# Patient Record
Sex: Male | Born: 1980
Health system: Southern US, Community
[De-identification: ages and names within clinical notes are randomized; demographics above are authoritative.]

## PROBLEM LIST (undated history)

## (undated) DIAGNOSIS — M25559 Pain in unspecified hip: Secondary | ICD-10-CM

## (undated) DIAGNOSIS — K219 Gastro-esophageal reflux disease without esophagitis: Secondary | ICD-10-CM

## (undated) DIAGNOSIS — E785 Hyperlipidemia, unspecified: Secondary | ICD-10-CM

## (undated) DIAGNOSIS — R079 Chest pain, unspecified: Secondary | ICD-10-CM

## (undated) HISTORY — DX: Hyperlipidemia, unspecified: E78.5

## (undated) HISTORY — DX: Pain in unspecified hip: M25.559

## (undated) HISTORY — DX: Gastro-esophageal reflux disease without esophagitis: K21.9

## (undated) HISTORY — PX: REPAIR IMPERFORATE ANUS / ANORECTOPLASTY: SUR1185

## (undated) HISTORY — PX: OTHER SURGICAL HISTORY: SHX169

## (undated) HISTORY — DX: Chest pain, unspecified: R07.9

---

## 2014-11-07 ENCOUNTER — Telehealth (HOSPITAL_COMMUNITY): Payer: Self-pay | Admitting: *Deleted

## 2014-11-08 ENCOUNTER — Other Ambulatory Visit (HOSPITAL_COMMUNITY): Payer: Self-pay | Admitting: Family Medicine

## 2014-11-08 DIAGNOSIS — R079 Chest pain, unspecified: Secondary | ICD-10-CM

## 2014-11-09 ENCOUNTER — Ambulatory Visit (HOSPITAL_COMMUNITY)
Admission: RE | Admit: 2014-11-09 | Discharge: 2014-11-09 | Disposition: A | Discharge status: Home / Self Care | Payer: 59 | Source: Ambulatory Visit | Attending: Family Medicine | Admitting: Family Medicine

## 2014-11-09 DIAGNOSIS — R079 Chest pain, unspecified: Secondary | ICD-10-CM | POA: Insufficient documentation

## 2014-11-10 ENCOUNTER — Other Ambulatory Visit (HOSPITAL_COMMUNITY): Payer: Self-pay | Admitting: Family Medicine

## 2014-11-10 DIAGNOSIS — R079 Chest pain, unspecified: Secondary | ICD-10-CM

## 2014-11-16 ENCOUNTER — Other Ambulatory Visit (HOSPITAL_COMMUNITY): Payer: 59

## 2014-12-09 DIAGNOSIS — M25559 Pain in unspecified hip: Secondary | ICD-10-CM | POA: Insufficient documentation

## 2014-12-09 DIAGNOSIS — R079 Chest pain, unspecified: Secondary | ICD-10-CM | POA: Insufficient documentation

## 2014-12-09 DIAGNOSIS — E785 Hyperlipidemia, unspecified: Secondary | ICD-10-CM | POA: Insufficient documentation

## 2014-12-09 DIAGNOSIS — K219 Gastro-esophageal reflux disease without esophagitis: Secondary | ICD-10-CM | POA: Insufficient documentation

## 2014-12-13 ENCOUNTER — Ambulatory Visit (INDEPENDENT_AMBULATORY_CARE_PROVIDER_SITE_OTHER): Payer: 59 | Admitting: Cardiology

## 2014-12-13 ENCOUNTER — Encounter: Payer: Self-pay | Admitting: Cardiology

## 2014-12-13 VITALS — BP 100/70 | HR 56 | Ht 66.0 in | Wt 137.8 lb

## 2014-12-13 DIAGNOSIS — R079 Chest pain, unspecified: Secondary | ICD-10-CM | POA: Diagnosis not present

## 2014-12-13 DIAGNOSIS — E785 Hyperlipidemia, unspecified: Secondary | ICD-10-CM | POA: Diagnosis not present

## 2014-12-13 DIAGNOSIS — K219 Gastro-esophageal reflux disease without esophagitis: Secondary | ICD-10-CM

## 2014-12-13 NOTE — Patient Instructions (Signed)
Medication Instructions:  The current medical regimen is effective;  continue present plan and medications.  Testing/Procedures: Your physician has requested that you have an exercise tolerance test. For further information please visit [email or web address] Please also follow instruction sheet, as given.  Follow-Up: Further follow up will be based on these results.  Thank you for choosing [facility]!!    If you need a refill on your cardiac medications before your next appointment, please call your pharmacy.

## 2014-12-13 NOTE — Progress Notes (Signed)
Cardiology Office Note   Date:  12/13/2014   ID:  Ronald Wilkins, DOB [birth year], MRN [id number]  PCP:  Frederich ChickWEBB, CAROL D, MD  Cardiologist:   Donato SchultzSKAINS, Elisha Mcgruder, MD       History of Present Illness: Ronald PasseyBhavinkumar Stalzer is a 34 y.o. male who presents for evaluation of cardiovascular risk in the setting of intermittent chest pain. Strong history of cardiac disease. Sometimes has intermittent chest pain normally lasting a proximal by 30 minutes duration but may be GERD.  Sometimes on treadmill feels some tightness occasionally. Sometimes after the run 30 min after feels tightness. This is intermittently.  2 years ago right sided pain after standing for prolonged times. Did ECG normal.   Dr. Yehuda BuddSpears felt CP and diaphoresis.   No DM, no HTN, no smoking. Had some while dancing. Rarely sweating. Drinks lots of water.   Grandfather 8160 died MI, 32 year old grandfather sibling died. Father has HTN.   Hemoglobin 15.4, creatinine 0.84, glucose 94, liver functions normal, TSH 0.7, LDL 134, triglycerides 177, HDL 32, non-HDL 169 labs were done by Dr. Shirlean Mylararol Webb. Has a positive history of positive PPD, 9 months of INH.    Past Medical History  Diagnosis Date  . GERD (gastroesophageal reflux disease)   . Chest pain   . Hip pain   . Hyperlipidemia     Past Surgical History  Procedure Laterality Date  . Colonscopy    . Repair imperforate anus / anorectoplasty       Current Outpatient Prescriptions  Medication Sig Dispense Refill  . pantoprazole (PROTONIX) 40 MG tablet Take 40 mg by mouth daily as needed (FOR STOMACH AND HEARTBURN).   5   No current facility-administered medications for this visit.    Allergies:   Review of patient's allergies indicates no known allergies.    Social History:  The patient  reports that he has never smoked. He does not have any smokeless tobacco history on file. He reports that he does not drink alcohol or use illicit drugs.   Family History:  The  patient's family history includes Healthy in his mother; Heart disease in his paternal grandfather and paternal uncle; Hypertension in his father; Kidney Stones in his brother.    ROS:  Please see the history of present illness.   Otherwise, review of systems are positive for occasional GERD-like symptoms.   All other systems are reviewed and negative.    PHYSICAL EXAM: VS:  BP 100/70 mmHg  Pulse 56  Ht 5\' 6"  (1.676 m)  Wt 137 lb 12.8 oz (62.506 kg)  BMI 22.25 kg/m2 , BMI Body mass index is 22.25 kg/(m^2). GEN: Well nourished, well developed, in no acute distress HEENT: normal Neck: no JVD, carotid bruits, or masses Cardiac: RRR; no murmurs, rubs, or gallops,no edema  Respiratory:  clear to auscultation bilaterally, normal work of breathing GI: soft, nontender, nondistended, + BS MS: no deformity or atrophy Skin: warm and dry, no rash Neuro:  Strength and sensation are intact Psych: euthymic mood, full affect   EKG:  Today 12/13/14-heart rate 56, sinus rhythm, premature atrial contractions noted. No other abnormalities noted. Personally reviewed.  Recent Labs: No results found for requested labs within last 365 days.    Lipid Panel No results found for: CHOL, TRIG, HDL, CHOLHDL, VLDL, LDLCALC, LDLDIRECT    Wt Readings from Last 3 Encounters:  12/13/14 137 lb 12.8 oz (62.506 kg)      Other studies Reviewed: Additional studies/ records that were  reviewed today include: Prior lab work, office notes, EKG. Review of the above records demonstrates: none   ASSESSMENT AND PLAN:  Atypical chest pain  - At times feels chest discomfort with exertion but not always while exercising. He has also felt some discomfort at times when dancing. We will go ahead and check an exercise treadmill test given his strong family history.  Family history of CAD  - Strong on his father's side.  - Continue with aggressive risk factor prevention.  Elevated cholesterol  - LDL is improved with  diet and exercise. Previously LDL was in the 160 range, currently in the 130 range. At this time, no indication for statin therapy. Continue with dietary and exercise modification.  GERD  - Consider trying Pepcid. Currently using Protonix.   Current medicines are reviewed at length with the patient today.  The patient does not have concerns regarding medicines.  The following changes have been made:  no change  Labs/ tests ordered today include:   Orders Placed This Encounter  Procedures  . Exercise Tolerance Test  . EKG 12-Lead     Disposition:   We will follow-up with the results of testing.  Mathews Robinsons, MD  12/13/2014 3:52 PM    [facility] [address] [place], [place], [place]  [address] Phone: [phone]; Fax: [phone]

## 2015-01-03 ENCOUNTER — Encounter: Payer: 59 | Admitting: Cardiology

## 2015-01-03 ENCOUNTER — Ambulatory Visit (INDEPENDENT_AMBULATORY_CARE_PROVIDER_SITE_OTHER): Payer: 59

## 2015-01-03 DIAGNOSIS — R079 Chest pain, unspecified: Secondary | ICD-10-CM | POA: Diagnosis not present

## 2015-01-03 DIAGNOSIS — E785 Hyperlipidemia, unspecified: Secondary | ICD-10-CM | POA: Diagnosis not present

## 2015-01-03 LAB — EXERCISE TOLERANCE TEST
CHL CUP RESTING HR STRESS: 72 {beats}/min
CSEPED: 10 min
CSEPHR: 101 %
Estimated workload: 11.7 METS
Exercise duration (sec): 0 s
MPHR: 188 {beats}/min
Peak HR: 190 {beats}/min
RPE: 16

## 2015-09-06 DIAGNOSIS — N529 Male erectile dysfunction, unspecified: Secondary | ICD-10-CM | POA: Diagnosis not present

## 2015-10-24 DIAGNOSIS — E785 Hyperlipidemia, unspecified: Secondary | ICD-10-CM | POA: Diagnosis not present

## 2015-10-24 DIAGNOSIS — Z0000 Encounter for general adult medical examination without abnormal findings: Secondary | ICD-10-CM | POA: Diagnosis not present

## 2015-12-07 MED FILL — PANTOPRAZOLE SOD DR 40 MG T: 40 | 90 days supply | Qty: 90 | Fill #0

## 2016-01-31 DIAGNOSIS — H52223 Regular astigmatism, bilateral: Secondary | ICD-10-CM | POA: Diagnosis not present

## 2016-02-06 ENCOUNTER — Telehealth (HOSPITAL_COMMUNITY): Payer: Self-pay | Admitting: Family Medicine

## 2016-02-06 ENCOUNTER — Other Ambulatory Visit: Payer: Self-pay | Admitting: Family Medicine

## 2016-02-06 DIAGNOSIS — R55 Syncope and collapse: Secondary | ICD-10-CM

## 2016-02-07 NOTE — Telephone Encounter (Signed)
Close encounter 

## 2016-02-09 ENCOUNTER — Other Ambulatory Visit: Payer: Self-pay

## 2016-02-09 ENCOUNTER — Ambulatory Visit (HOSPITAL_COMMUNITY): Payer: 59 | Attending: Cardiology

## 2016-02-09 DIAGNOSIS — E785 Hyperlipidemia, unspecified: Secondary | ICD-10-CM | POA: Insufficient documentation

## 2016-02-09 DIAGNOSIS — R55 Syncope and collapse: Secondary | ICD-10-CM

## 2016-02-09 DIAGNOSIS — Z8249 Family history of ischemic heart disease and other diseases of the circulatory system: Secondary | ICD-10-CM | POA: Diagnosis not present

## 2016-07-09 MED FILL — PANTOPRAZOLE SOD DR 40 MG T: 40 | 90 days supply | Qty: 90 | Fill #1

## 2016-08-17 IMAGING — DX DG CHEST 2V
2 series · 2 of 2 positions shown · non-contrast
Comparison: None.

CLINICAL DATA: Chest pain.

EXAM:
CHEST  2 VIEW

[chest pa]
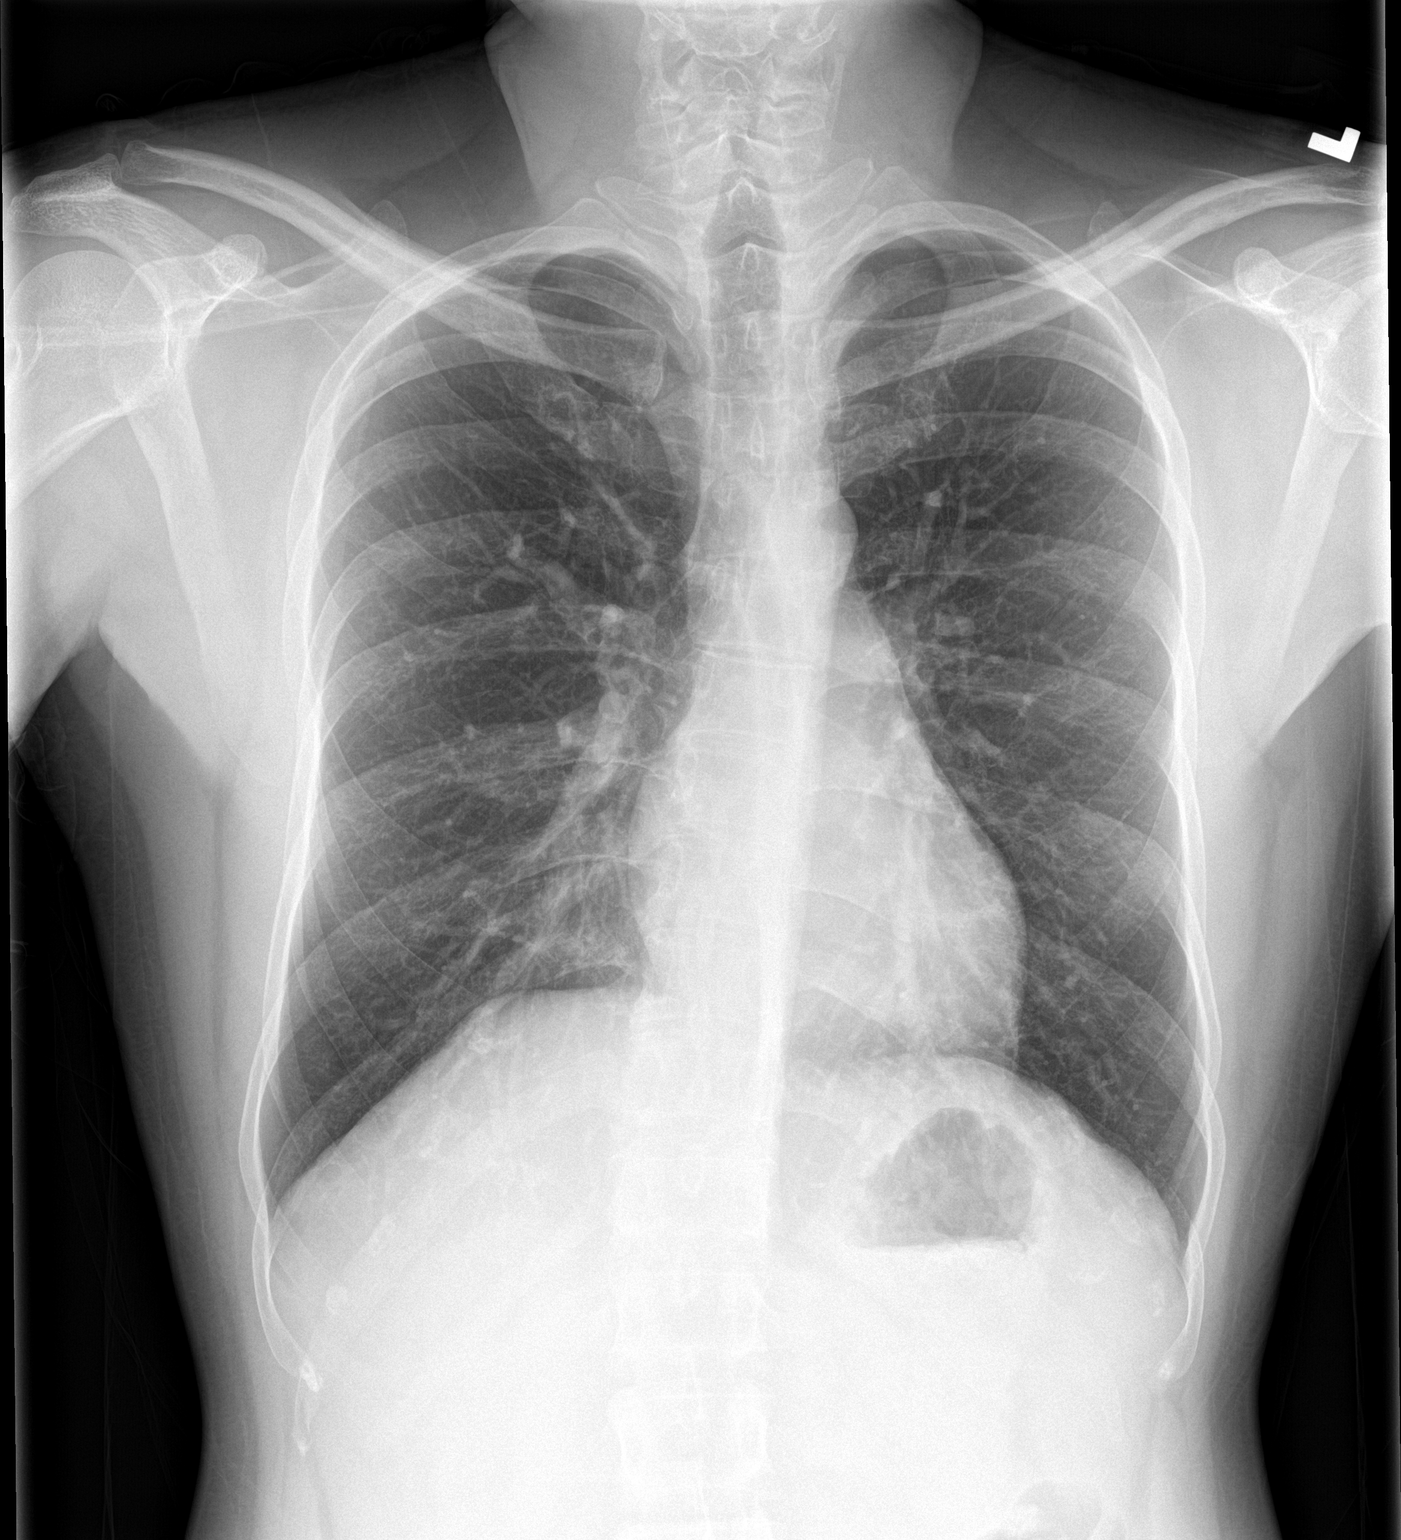

[chest lat]
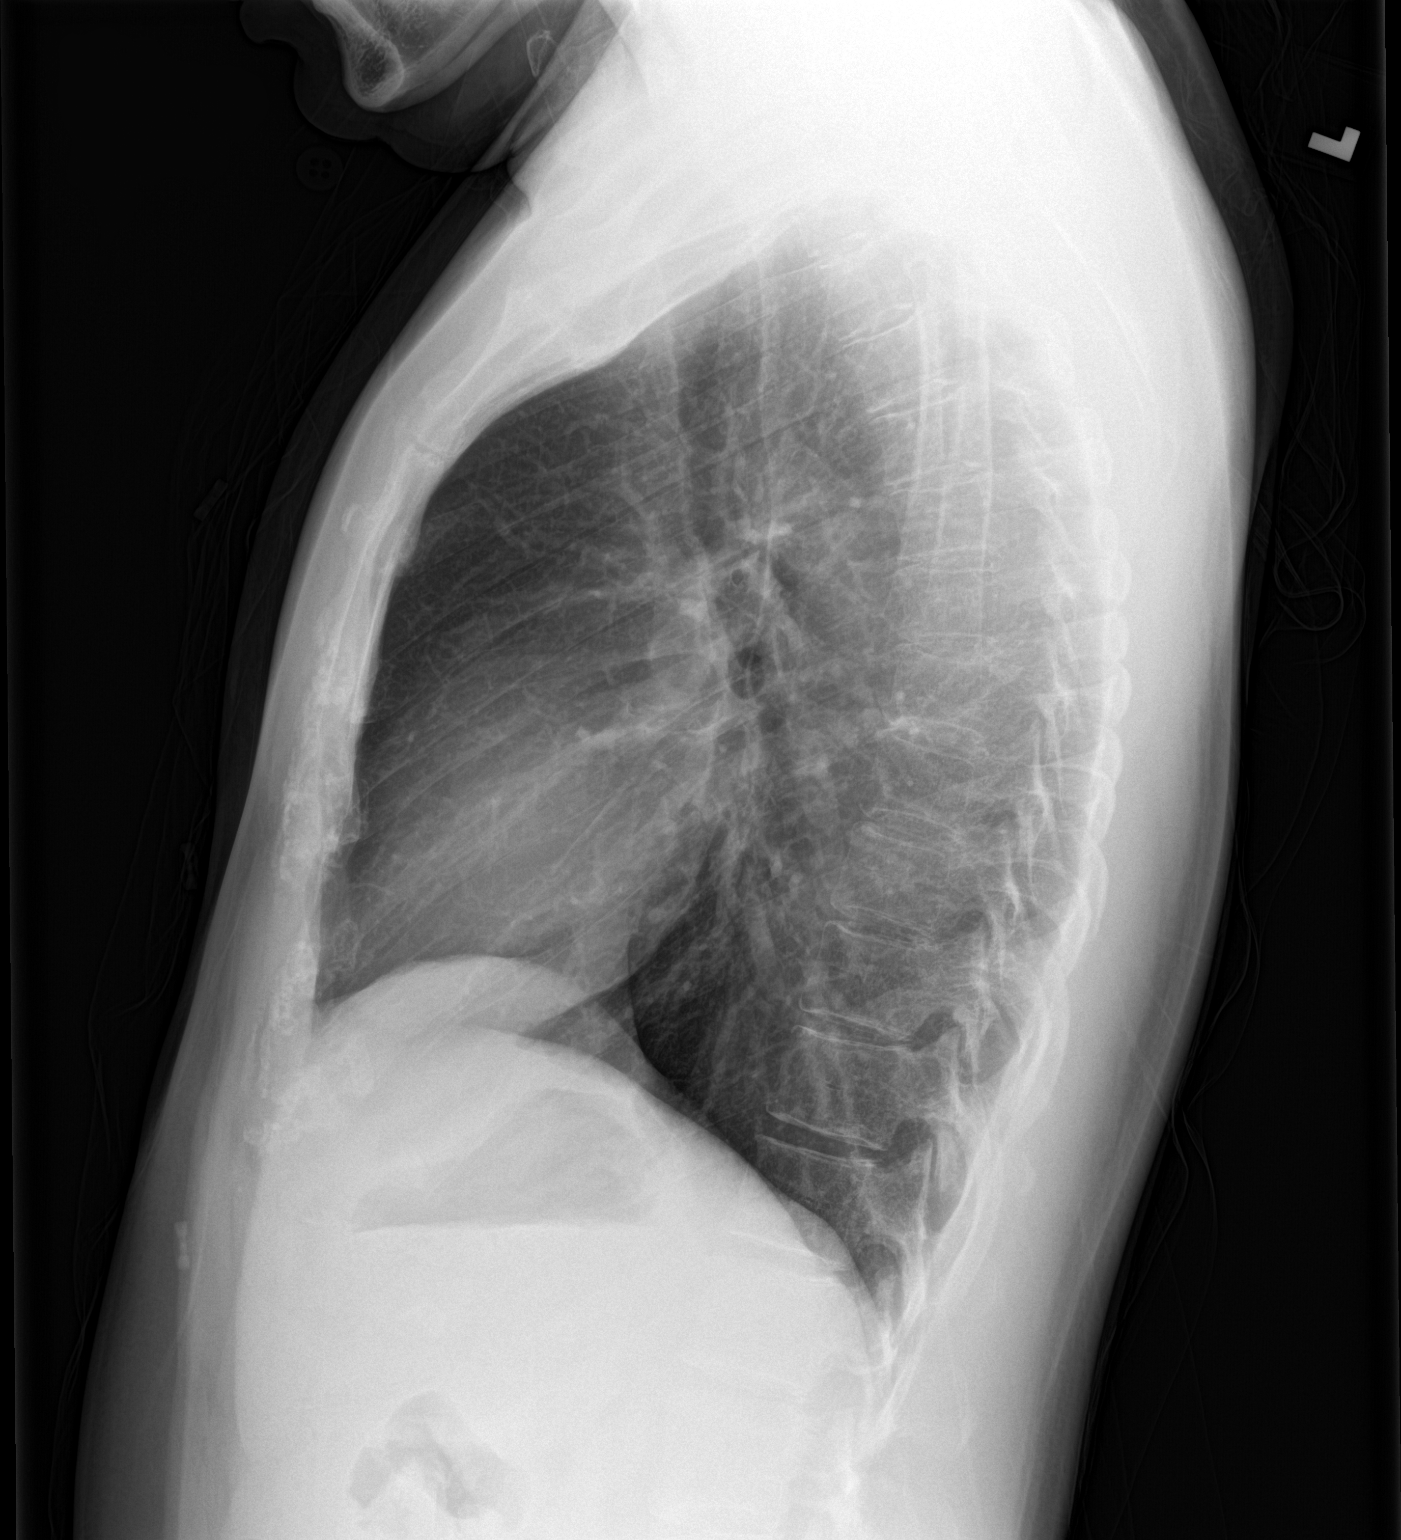

[2 of 2 positions shown; findings below may reference images not displayed]

FINDINGS: Mediastinum and hilar structures are normal. Heart size normal.
Minimal right base subsegmental atelectasis cannot be excluded. No
pleural effusion or pneumothorax. No acute bony abnormality .
IMPRESSION: Minimal right base subsegmental atelectasis cannot be excluded. Exam
is otherwise unremarkable.

## 2016-10-16 DIAGNOSIS — H52223 Regular astigmatism, bilateral: Secondary | ICD-10-CM | POA: Diagnosis not present

## 2016-11-19 DIAGNOSIS — Z0000 Encounter for general adult medical examination without abnormal findings: Secondary | ICD-10-CM | POA: Diagnosis not present

## 2016-11-19 DIAGNOSIS — Z125 Encounter for screening for malignant neoplasm of prostate: Secondary | ICD-10-CM | POA: Diagnosis not present

## 2016-11-19 DIAGNOSIS — R634 Abnormal weight loss: Secondary | ICD-10-CM | POA: Diagnosis not present

## 2016-11-19 DIAGNOSIS — R5382 Chronic fatigue, unspecified: Secondary | ICD-10-CM | POA: Diagnosis not present

## 2016-11-19 DIAGNOSIS — Z23 Encounter for immunization: Secondary | ICD-10-CM | POA: Diagnosis not present

## 2016-11-19 DIAGNOSIS — R1084 Generalized abdominal pain: Secondary | ICD-10-CM | POA: Diagnosis not present

## 2016-11-19 DIAGNOSIS — E785 Hyperlipidemia, unspecified: Secondary | ICD-10-CM | POA: Diagnosis not present

## 2016-11-19 DIAGNOSIS — Z5181 Encounter for therapeutic drug level monitoring: Secondary | ICD-10-CM | POA: Diagnosis not present

## 2016-11-19 MED FILL — PANTOPRAZOLE SOD DR 40 MG T: 40 | 90 days supply | Qty: 90 | Fill #0

## 2017-01-08 DIAGNOSIS — E291 Testicular hypofunction: Secondary | ICD-10-CM | POA: Diagnosis not present

## 2017-01-08 DIAGNOSIS — F524 Premature ejaculation: Secondary | ICD-10-CM | POA: Diagnosis not present

## 2017-03-05 MED FILL — VIT D2 1.25 MG (50,000 UNIT: 1.25 MG | 56 days supply | Qty: 8 | Fill #0

## 2018-02-20 MED FILL — OSELTAMIVIR PHOSPHATE 75 MG: 75 | 10 days supply | Qty: 10 | Fill #0

## 2018-09-14 MED FILL — PANTOPRAZOLE SOD DR 40 MG T: 40 | 90 days supply | Qty: 90 | Fill #0

## 2019-05-11 MED FILL — PANTOPRAZOLE SOD DR 40 MG T: 40 | 90 days supply | Qty: 90 | Fill #1

## 2019-05-12 MED FILL — FLUTICASONE PROP 50 MCG SPR: 50 | 60 days supply | Qty: 16 | Fill #0

## 2019-12-31 ENCOUNTER — Other Ambulatory Visit (HOSPITAL_COMMUNITY): Payer: Self-pay | Admitting: Family Medicine

## 2019-12-31 MED FILL — PANTOPRAZOLE SOD DR 40 MG T: 40 | 90 days supply | Qty: 90 | Fill #0

## 2020-05-05 ENCOUNTER — Other Ambulatory Visit (HOSPITAL_COMMUNITY): Payer: Self-pay | Admitting: Family Medicine

## 2020-05-23 ENCOUNTER — Other Ambulatory Visit (HOSPITAL_COMMUNITY): Payer: Self-pay

## 2020-05-23 MED ORDER — AZELASTINE HCL 137 MCG/SPRAY NA SOLN
NASAL | 11 refills | Status: AC
  Filled 2020-05-23: qty 30, 50d supply, fill #0

## 2020-07-13 ENCOUNTER — Other Ambulatory Visit (HOSPITAL_COMMUNITY): Payer: Self-pay

## 2020-07-13 MED ORDER — MOLNUPIRAVIR 200 MG PO CAPS
800.0000 mg | ORAL_CAPSULE | Freq: Two times a day (BID) | ORAL | 0 refills | Status: AC
  Filled 2020-07-13: qty 40, 5d supply, fill #0

## 2020-07-15 ENCOUNTER — Other Ambulatory Visit (HOSPITAL_COMMUNITY): Payer: Self-pay

## 2020-07-18 ENCOUNTER — Other Ambulatory Visit (HOSPITAL_COMMUNITY): Payer: Self-pay

## 2020-12-12 ENCOUNTER — Other Ambulatory Visit (HOSPITAL_COMMUNITY): Payer: Self-pay

## 2020-12-12 MED FILL — Pantoprazole Sodium EC Tab 40 MG (Base Equiv): ORAL | 90 days supply | Qty: 90 | Fill #0 | Status: AC

## 2021-11-21 ENCOUNTER — Other Ambulatory Visit (HOSPITAL_COMMUNITY): Payer: Self-pay

## 2021-11-21 MED ORDER — METHYLPREDNISOLONE 4 MG PO TBPK
ORAL_TABLET | ORAL | 0 refills | Status: AC
  Filled 2021-11-21: qty 21, 6d supply, fill #0

## 2021-12-13 ENCOUNTER — Other Ambulatory Visit (HOSPITAL_COMMUNITY): Payer: Self-pay

## 2021-12-13 MED ORDER — PANTOPRAZOLE SODIUM 40 MG PO TBEC
40.0000 mg | DELAYED_RELEASE_TABLET | Freq: Every day | ORAL | 3 refills | Status: AC | PRN
  Filled 2021-12-13: qty 90, 90d supply, fill #0
